# Patient Record
Sex: Female | Born: 2000
Health system: Southern US, Community
[De-identification: ages and names within clinical notes are randomized; demographics above are authoritative.]

---
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
## 2019-04-10 ENCOUNTER — Ambulatory Visit (INDEPENDENT_AMBULATORY_CARE_PROVIDER_SITE_OTHER): Payer: BC Managed Care – PPO | Admitting: Family Medicine

## 2019-04-10 ENCOUNTER — Other Ambulatory Visit: Payer: Self-pay

## 2019-04-10 VITALS — BP 122/58 | Ht 65.5 in | Wt 250.0 lb

## 2019-04-10 DIAGNOSIS — M25561 Pain in right knee: Secondary | ICD-10-CM | POA: Diagnosis not present

## 2019-04-10 DIAGNOSIS — G8929 Other chronic pain: Secondary | ICD-10-CM | POA: Diagnosis not present

## 2019-04-10 NOTE — Progress Notes (Signed)
    SUBJECTIVE:   CHIEF COMPLAINT: R knee pain  HPI:  Patient endorsing aggressively worsening right anterior knee pain that has been ongoing.  Denies any recent trauma or injury but did sustain a knee injury with playing volleyball a few years ago.  Reports receiving an MRI and a corticosteroid injection in this knee previously.  Pain described as constant soreness.  Does endorse stiffness and popping and crunching when walking up stairs.  She works at [employer] and is on her feet for 6-8 hours at a time.  Denies overlying skin changes, redness, warmth.  Pain is worse with extension.  PERTINENT  PMH / PSH: obesity, heart murmur, pes planus  OBJECTIVE:   BP (!) 122/58   Ht 5' 5.5" (1.664 m)   Wt 250 lb (113.4 kg)   BMI 40.97 kg/m   GEN: Morbidly obese, in NAD MSK:  Knee: Inspection: No obvious deformities or asymmetry Palpation: Nontender to palpation over bilateral joint lines, quad tendon, patellar compression.  Point tender to palpation over patellar tendon. ROM: Full AROM with flexion, extension. Strength: 5/5 with resisted flexion, extension, dorsiflexion, plantarflexion.  4/5 bilateral hip abduction and adduction. Stability: No joint laxity Special Tests: -Negative McMurray's -Negative Thessaly's -Negative Lachman's -Negative anterior/posterior drawer Neurovascular: Intact  Limited ultrasound of right knee Thickening of the patellar tendon with small hypoechoic area suggesting edema and inflammation within the tendon.  Findings suggestive of patellar tendinopathy.  No edema appreciable in suprapatellar bursa.  No bony abnormalities appreciated.   ASSESSMENT/PLAN:   Knee pain Exam and ultrasound consistent with patellar tendinopathy.  Will provide strengthening exercises as well as knee strap for additional support and pain relief.  Continue NSAIDs as needed.  Follow-up as needed.   Ellwood Dense, DO PGY-3, [facility] 04/10/2019 3:34 PM

## 2019-04-10 NOTE — Patient Instructions (Signed)
It was great to see you!  Our plans for today:  - Try a knee strap (see below). Put this right below where you are having the pain. - Do the strengthening exercises daily as prescribed. - Continue to take Tylenol, ibuprofen as needed. Ice after work or prolonged periods of standing can be helpful. - Follow-up as needed.     Patellar Tendinitis  Patellar tendinitis is also called jumper's knee or patellar tendinopathy. This condition happens when there is damage to and inflammation of the patellar tendon. Tendons are cord-like tissues that connect muscles to bones. The patellar tendon connects the bottom of the kneecap (patella) to the top of the shin bone (tibia). Patellar tendinitis causes pain in the front of the knee. The condition happens in the following stages:  Stage 1: In this stage, you have pain only after activity.  Stage 2: In this stage, you have pain during and after activity.  Stage 3: In this stage, you have pain at rest as well as during and after activity. The pain limits your ability to do the activity.  Stage 4: In this stage, the tendon tears and severely limits your activity. What are the causes? This condition is caused by repeated (repetitive) stress on the tendon. This stress may cause the tendon to stretch, swell, thicken, or tear. What increases the risk? The following factors may make you more likely to develop this condition:  Participating in sports that involve running, kicking, and jumping, especially on hard surfaces. These include: ? Basketball. ? Volleyball. ? Soccer. ? Track and field.  Training too hard.  Having tight thigh muscles.  Having received steroid injections in the tendon.  Having had knee surgery.  Being 74-37 years old. What are the signs or symptoms? The main symptom of this condition is pain and swelling in the front of the knee. The pain usually starts slowly and gradually gets worse. It may become painful to straighten  your leg. The pain may get worse when you walk, run, or jump. How is this diagnosed? This condition may be diagnosed based on:  Your symptoms.  Your medical history.  A physical exam. During the physical exam, your health care provider may check for: ? Tenderness in your patella. ? Tightness in your thigh muscles. ? Pain when you straighten your knee.  Imaging tests, including: ? X-rays. These will show the position and condition of your patella. ? An MRI. This will show any abnormality of the tendon. ? Ultrasound. This will show any swelling or other abnormalities of the tendon. How is this treated? Treatment for this condition depends on the stage of the condition. It may involve:  Avoiding activities that cause pain, such as jumping.  Icing and elevating your knee.  Having sound wave stimulation to promote healing.  Doing stretching and strengthening exercises (physical therapy) when pain and swelling improve.  Wearing a knee brace. This may be needed if your condition does not improve with treatment.  Using crutches or a walker. This may be needed if your condition does not improve with treatment.  Surgery. This may be done if you have stage 4 tendinitis. Follow these instructions at home: If you have a brace:  Wear the brace as told by your health care provider. Remove it only as told by your health care provider.  Loosen the brace if your toes tingle, become numb, or turn cold and blue.  Keep the brace clean.  If the brace is not waterproof: ? Do  not let it get wet. ? Cover it with a watertight covering when you take a bath or shower.  Ask your health care provider when it is safe for you to drive. Managing pain, stiffness, and swelling   If directed, put ice on the injured area. ? If you have a removable brace, remove it as told by your health care provider. ? Put ice in a plastic bag. ? Place a towel between your skin and the bag. ? Leave the ice on for  20 minutes, 2-3 times a day.  Move your toes often to reduce stiffness and swelling.  Raise (elevate) your knee above the level of your heart while you are sitting or lying down. Activity  Do not use the injured limb to support your body weight until your health care provider says that you can. Use your crutches or a walker as told by your health care provider.  Return to your normal activities as told by your health care provider. Ask your health care provider what activities are safe for you.  Do exercises as told by your health care provider or physical therapist. General instructions  Take over-the-counter and prescription medicines only as told by your health care provider.  Do not use any products that contain nicotine or tobacco, such as cigarettes, e-cigarettes, and chewing tobacco. These can delay healing. If you need help quitting, ask your health care provider.  Keep all follow-up visits as told by your health care provider. This is important. How is this prevented?  Warm up and stretch before being active.  Cool down and stretch after being active.  Give your body time to rest between periods of activity. ? You may need to reduce how often you play a sport that requires frequent jumping.  Make sure to use equipment that fits you.  Be safe and responsible while being active. This will help you avoid falls which can damage the tendon.  Do at least 150 minutes of moderate-intensity exercise each week, such as brisk walking or water aerobics.  Maintain physical fitness, including: ? Strength. ? Flexibility. ? Cardiovascular fitness. ? Endurance. Contact a health care provider if:  Your symptoms have not improved in 6 weeks.  Your symptoms get worse. Summary  Patellar tendinitis is also called jumper's knee or patellar tendinopathy. This condition happens when there is damage to and inflammation of the patellar tendon.  Treatment for this condition depends on  the stage of the condition and may include rest, ice, exercises, medicines, and surgery.  Do not use the injured limb to support your body weight until your health care provider says that you can.  Take over-the-counter and prescription medicines only as told by your health care provider.  Keep all follow-up visits as told by your health care provider. This is important. This information is not intended to replace advice given to you by your health care provider. Make sure you discuss any questions you have with your health care provider. Document Revised: 04/20/2018 Document Reviewed: 11/21/2017 Elsevier Patient Education  2020 [employer].

## 2019-04-10 NOTE — Assessment & Plan Note (Signed)
Exam and ultrasound consistent with patellar tendinopathy.  Will provide strengthening exercises as well as knee strap for additional support and pain relief.  Continue NSAIDs as needed.  Follow-up as needed.

## 2019-04-11 ENCOUNTER — Encounter: Payer: Self-pay | Admitting: Family Medicine

## 2019-06-14 DIAGNOSIS — Z20822 Contact with and (suspected) exposure to covid-19: Secondary | ICD-10-CM | POA: Diagnosis not present

## 2019-06-14 DIAGNOSIS — Z03818 Encounter for observation for suspected exposure to other biological agents ruled out: Secondary | ICD-10-CM | POA: Diagnosis not present

## 2019-06-25 ENCOUNTER — Other Ambulatory Visit: Payer: Self-pay | Admitting: Family Medicine

## 2019-07-31 ENCOUNTER — Encounter: Payer: Self-pay | Admitting: Family Medicine

## 2019-07-31 ENCOUNTER — Ambulatory Visit (INDEPENDENT_AMBULATORY_CARE_PROVIDER_SITE_OTHER): Payer: BC Managed Care – PPO | Admitting: Family Medicine

## 2019-07-31 ENCOUNTER — Other Ambulatory Visit: Payer: Self-pay

## 2019-07-31 VITALS — BP 108/78 | HR 75 | Ht 65.0 in | Wt 290.0 lb

## 2019-07-31 DIAGNOSIS — Z00129 Encounter for routine child health examination without abnormal findings: Secondary | ICD-10-CM

## 2019-07-31 DIAGNOSIS — H5213 Myopia, bilateral: Secondary | ICD-10-CM | POA: Diagnosis not present

## 2019-07-31 DIAGNOSIS — Z0000 Encounter for general adult medical examination without abnormal findings: Secondary | ICD-10-CM | POA: Diagnosis not present

## 2019-07-31 DIAGNOSIS — Z003 Encounter for examination for adolescent development state: Secondary | ICD-10-CM

## 2019-07-31 LAB — POCT GLYCOSYLATED HEMOGLOBIN (HGB A1C): Hemoglobin A1C: 5.3 % (ref 4.0–5.6)

## 2019-07-31 NOTE — Patient Instructions (Addendum)
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   Please make sure you continue to stay physically active and attempt to follow a well-balanced diet when you can.  You can always try meal prepping to avoid those times when you are busy.  Please call us if you have any questions or concerns.

## 2019-07-31 NOTE — Assessment & Plan Note (Addendum)
Reviewed past medical, surgical, and social history.  Medications updated in epic.  Health maintenance reviewed and updated recent Covid vaccination in 3-04/2019.  Will additionally obtain screening A1c due to elevated BMI.  Filled out band physical form.  Encouraged working towards a well-balanced diet and staying physically active.

## 2019-07-31 NOTE — Assessment & Plan Note (Signed)
Abnormal vision screening, patient reports she wears glasses occasionally to see far distances, does not wear everyday. Encouraged her to wear her glasses daily, especially with driving, and follow up with her optometrist.

## 2019-07-31 NOTE — Progress Notes (Signed)
.     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  She currently is at [employer] and plays the [facility] [employer] [facility] [employer], needs a sports physical form filled out.  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  GYN: Currently take Sprintec for contraception.  Not currently sexually active and does not want any STD screening today.  Menstrual cycles monthly.   Risk assessment: Denies any chest pain, dyspnea on exertion, SOB, palpitations, or syncope.  Currently exercising at the gym several days a week in between band practice and trying to follow a well-balanced diet.  No family history of significant cardiac disease or sudden death.   PERTINENT  PMH / PSH: Elevated BMI at 48, heart murmur several years ago  OBJECTIVE:   BP 108/78   Pulse 75   Ht 5\' 5"  (1.651 m)   Wt 290 lb (131.5 kg)   SpO2 98%   BMI 48.26 kg/m   General: Alert, NAD HEENT: NCAT, MMM, oropharynx nonerythematous, EOMI, PERRLA Cardiac: RRR no m/g/r appreciated Lungs: Clear bilaterally, no increased WOB  Abdomen: soft, non-tender, non-distended, normoactive BS Msk: Moves all extremities spontaneously, able to walk on tiptoes and heels without concern, normal gait Ext: Warm, dry, 2+ distal pulses, no edema   Psych: Normal mood and affect, engages in conversation with appropriate eye contact   Hearing Screening   125Hz  250Hz  500Hz  1000Hz  2000Hz  3000Hz  4000Hz  6000Hz  8000Hz   Right ear:           Left ear:             Visual Acuity Screening   Right eye Left eye Both eyes  Without correction: 20/100 20/40 20/40  With correction:       ASSESSMENT/PLAN:   Well adolescent visit Reviewed past medical, surgical, and social history.  Medications updated in epic.  Health maintenance reviewed and updated recent Covid vaccination in 3-04/2019.  Will additionally obtain screening A1c due to elevated BMI.  Filled out  band physical form.  Encouraged working towards a well-balanced diet and staying physically active.  Myopia of both eyes Abnormal vision screening, patient reports she wears glasses occasionally to see far distances, does not wear everyday. Encouraged her to wear her glasses daily, especially with driving, and follow up with her optometrist.      Follow-up annually or sooner if needed.  , DO [facility]

## 2019-08-17 DIAGNOSIS — R110 Nausea: Secondary | ICD-10-CM | POA: Diagnosis not present

## 2019-08-17 DIAGNOSIS — D649 Anemia, unspecified: Secondary | ICD-10-CM | POA: Diagnosis not present

## 2019-08-17 DIAGNOSIS — E860 Dehydration: Secondary | ICD-10-CM | POA: Diagnosis not present

## 2019-09-05 ENCOUNTER — Encounter: Payer: Self-pay | Admitting: Family Medicine

## 2019-09-05 ENCOUNTER — Ambulatory Visit (INDEPENDENT_AMBULATORY_CARE_PROVIDER_SITE_OTHER): Payer: BC Managed Care – PPO | Admitting: Family Medicine

## 2019-09-05 ENCOUNTER — Other Ambulatory Visit: Payer: Self-pay

## 2019-09-05 VITALS — BP 104/68 | HR 78 | Ht 65.0 in | Wt 283.6 lb

## 2019-09-05 DIAGNOSIS — D649 Anemia, unspecified: Secondary | ICD-10-CM

## 2019-09-05 NOTE — Patient Instructions (Signed)
I will send you a note about your labs. You can also view them in MyChart. Great to see you and have fun on your band trip to [facility]!

## 2019-09-05 NOTE — Progress Notes (Signed)
° ° °  CHIEF COMPLAINT / HPI:  was told by student health she was anemic. She has no symptoms. She does not remember the hgb level. Menses regular, occasionally heavy. Active and no fatigue, no SOB, no dizziness.her Mom is very worried about this and wants her checked "for everything". 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 Looking forward to a competition they have this coming weekend.   PERTINENT  PMH / PSH: I have reviewed the patients medications, allergies, past medical and surgical history, smoking status and updated in the EMR as appropriate.   OBJECTIVE:  BP 104/68    Pulse 78    Ht 5\' 5"  (1.651 m)    Wt 283 lb 9.6 oz (128.6 kg)    SpO2 98%    BMI 47.19 kg/m  GEN WD WN NAD CV RRR  ASSESSMENT / PLAN: Report of anemia by lab result at student health Parental concerns  Will check CBC and BMP.  No problem-specific Assessment & Plan notes found for this encounter.   MD

## 2019-09-06 LAB — BASIC METABOLIC PANEL
BUN/Creatinine Ratio: 13 (ref 9–23)
BUN: 7 mg/dL (ref 6–20)
CO2: 24 mmol/L (ref 20–29)
Calcium: 9.3 mg/dL (ref 8.7–10.2)
Chloride: 105 mmol/L (ref 96–106)
Creatinine, Ser: 0.55 mg/dL — ABNORMAL LOW (ref 0.57–1.00)
GFR calc Af Amer: 156 mL/min/{1.73_m2} (ref >59)
GFR calc non Af Amer: 135 mL/min/{1.73_m2} (ref >59)
Glucose: 79 mg/dL (ref 65–99)
Potassium: 3.9 mmol/L (ref 3.5–5.2)
Sodium: 143 mmol/L (ref 134–144)

## 2019-09-06 LAB — CBC
Hematocrit: 36.9 % (ref 34.0–46.6)
Hemoglobin: 11.6 g/dL (ref 11.1–15.9)
MCH: 23.6 pg — ABNORMAL LOW (ref 26.6–33.0)
MCHC: 31.4 g/dL — ABNORMAL LOW (ref 31.5–35.7)
MCV: 75 fL — ABNORMAL LOW (ref 79–97)
Platelets: 368 10*3/uL (ref 150–450)
RBC: 4.91 x10E6/uL (ref 3.77–5.28)
RDW: 14.6 % (ref 11.7–15.4)
WBC: 10.9 10*3/uL — ABNORMAL HIGH (ref 3.4–10.8)

## 2019-09-10 ENCOUNTER — Encounter: Payer: Self-pay | Admitting: Family Medicine

## 2019-09-10 DIAGNOSIS — R718 Other abnormality of red blood cells: Secondary | ICD-10-CM | POA: Insufficient documentation

## 2019-09-10 NOTE — Progress Notes (Signed)
See letter to patient Mild decreased MCV and MCH Both are stable

## 2019-11-14 ENCOUNTER — Ambulatory Visit: Payer: BC Managed Care – PPO | Attending: Family

## 2019-11-14 DIAGNOSIS — Z23 Encounter for immunization: Secondary | ICD-10-CM

## 2020-01-27 NOTE — Progress Notes (Signed)
   Covid-19 Vaccination Clinic  Name:  Tracy Ayala    MRN: [id number] DOB: 08-Aug-2000  01/27/2020  [name] was observed post Covid-19 immunization for 15 minutes without incident. She was provided with Vaccine Information Sheet and instruction to access the V-Safe system.   [name] was instructed to call 911 with any severe reactions post vaccine: Marland Kitchen Difficulty breathing  . Swelling of face and throat  . A fast heartbeat  . A bad rash all over body  . Dizziness and weakness   Immunizations Administered    Name Date Dose VIS Date Route   Pfizer COVID-19 Vaccine 11/14/2019  9:30 AM 0.3 mL 10/31/2019 Intramuscular   Manufacturer: [employer], [employer]   Lot: [id number]   NDC: [address]-2

## 2020-04-09 IMAGING — CR DG ANKLE COMPLETE 3+V*R*
3 series · 3 of 3 positions shown · non-contrast
Comparison: None.

CLINICAL DATA: Twisting injury several weeks ago, initial encounter

EXAM:
RIGHT ANKLE - COMPLETE 3+ VIEW

[t ankle joint ap right]
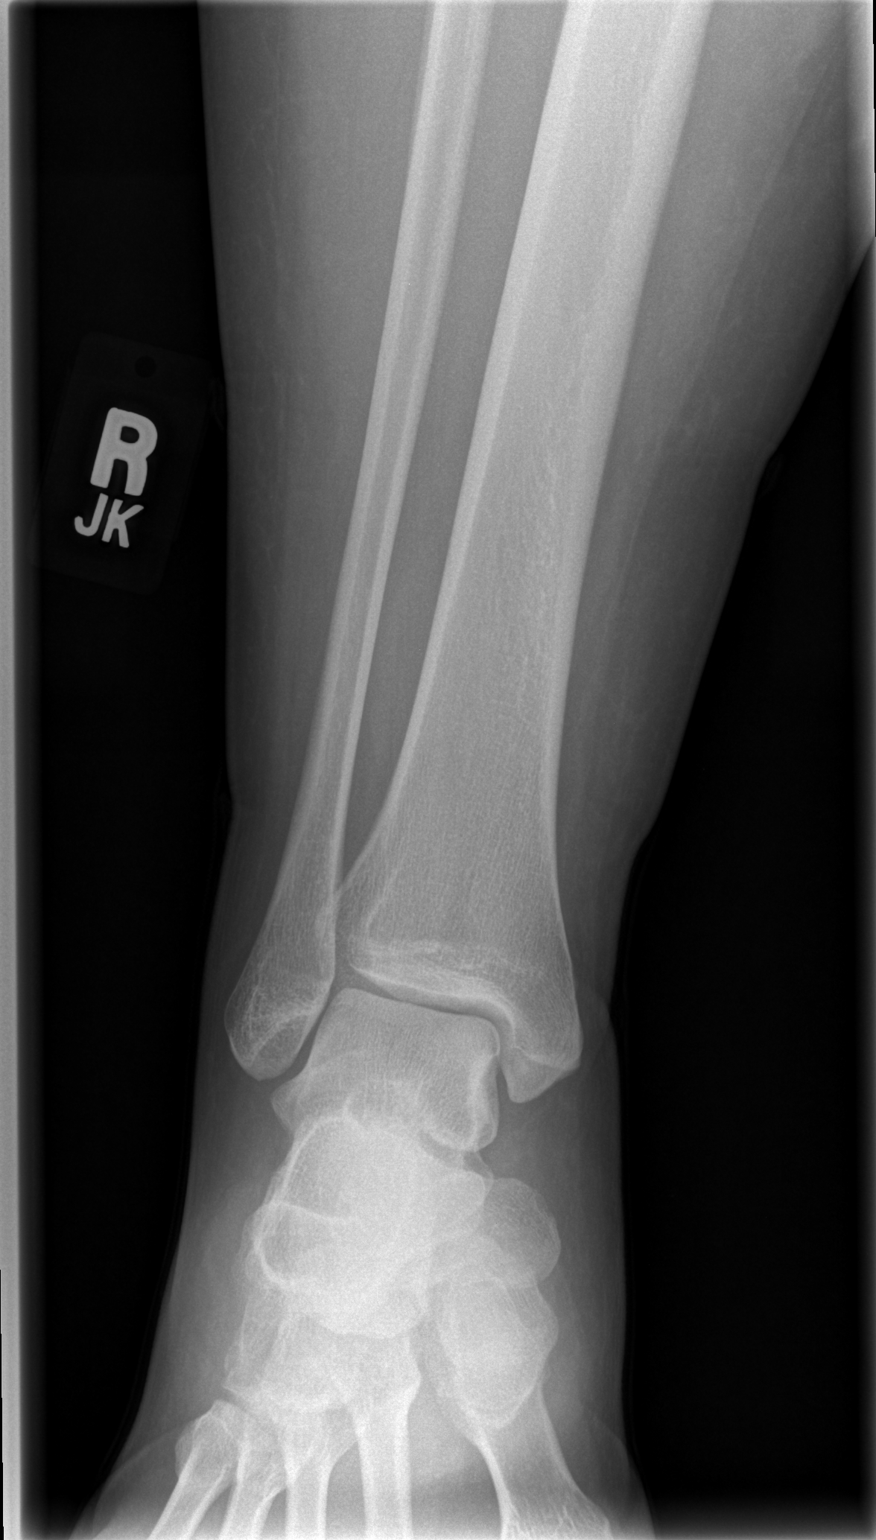

[t ankle joint oblique right]
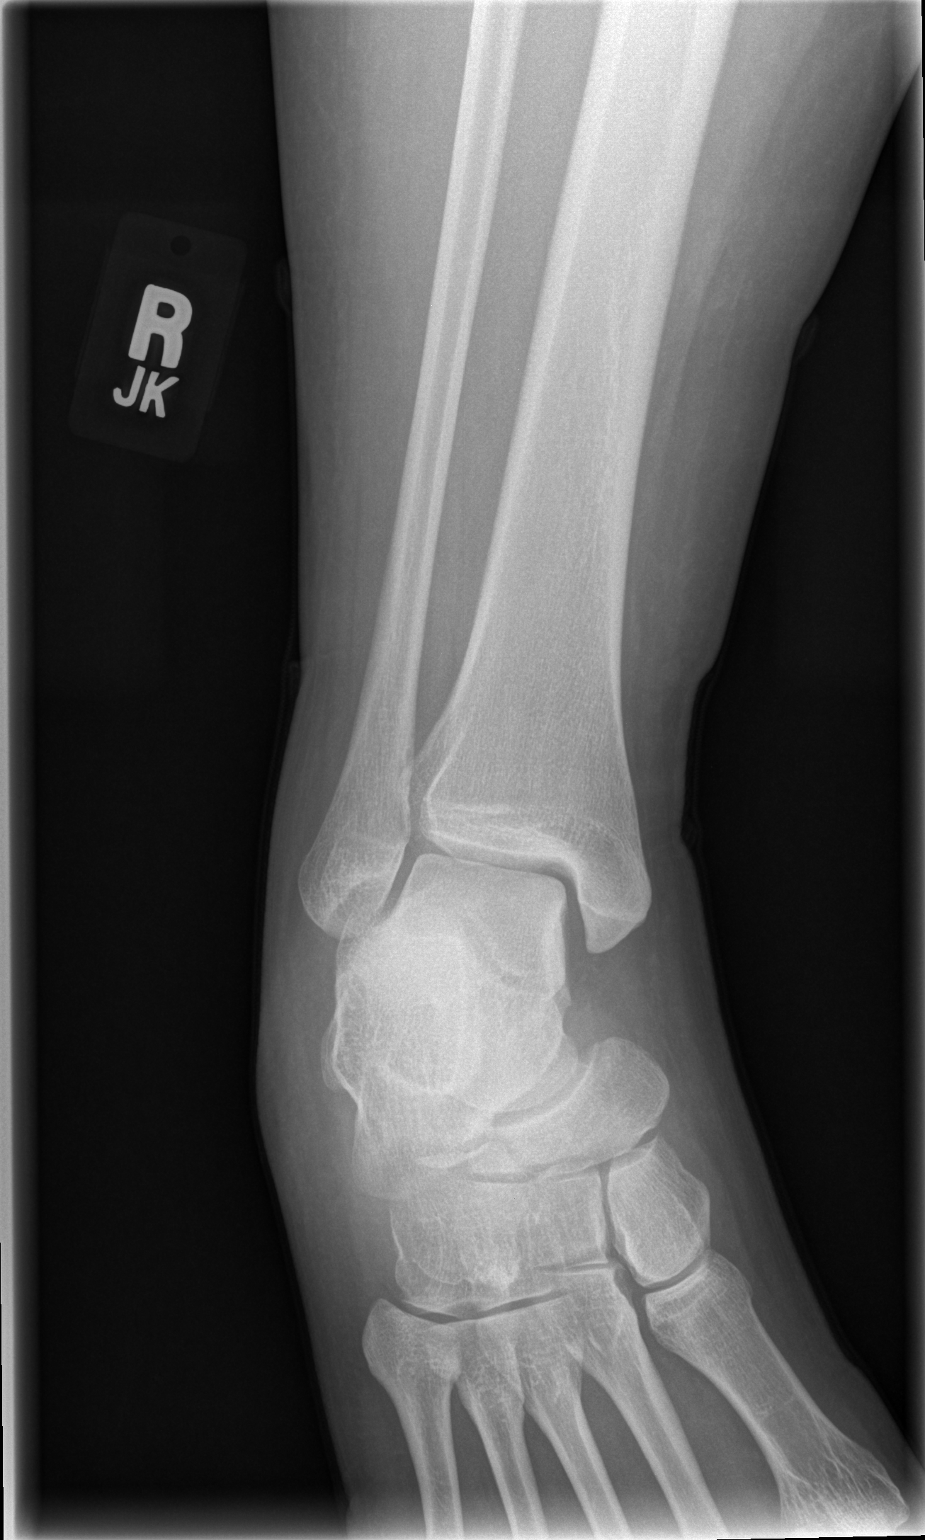

[t ankle joint lat right]
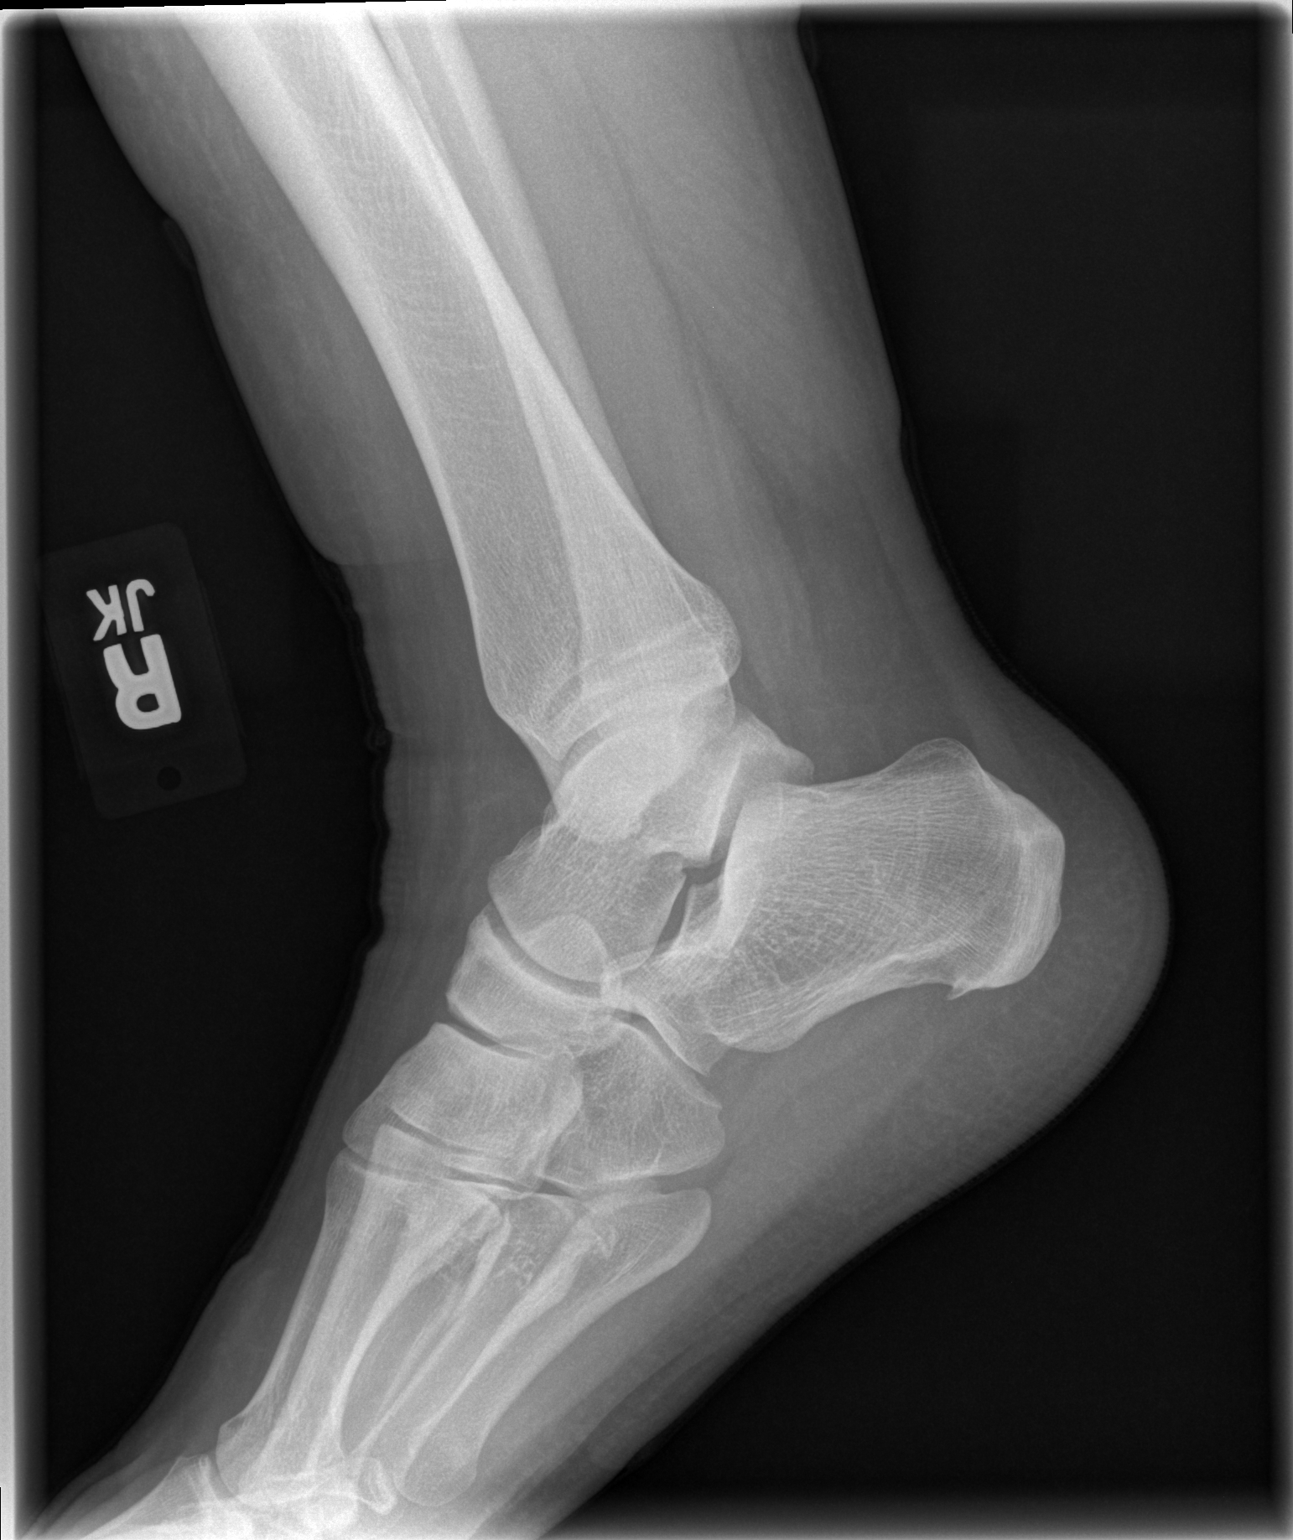

[3 of 3 positions shown; findings below may reference images not displayed]

FINDINGS: No acute fracture or dislocation is noted. No gross soft tissue
abnormality is seen. Small plantar calcaneal spur is noted.
IMPRESSION: No acute abnormality noted.

## 2020-04-29 ENCOUNTER — Other Ambulatory Visit: Payer: Self-pay

## 2020-04-29 ENCOUNTER — Ambulatory Visit (INDEPENDENT_AMBULATORY_CARE_PROVIDER_SITE_OTHER): Payer: Self-pay | Admitting: Family Medicine

## 2020-04-29 DIAGNOSIS — J302 Other seasonal allergic rhinitis: Secondary | ICD-10-CM | POA: Insufficient documentation

## 2020-04-29 DIAGNOSIS — J301 Allergic rhinitis due to pollen: Secondary | ICD-10-CM

## 2020-04-29 MED ORDER — CETIRIZINE HCL 10 MG PO TABS: 10.0000 mg | ORAL_TABLET | Freq: Every day | ORAL | 11 refills | Status: DC

## 2020-04-29 MED ORDER — FLUTICASONE PROPIONATE 50 MCG/ACT NA SUSP: 2.0000 | Freq: Every day | NASAL | 6 refills | Status: DC

## 2020-04-29 NOTE — Progress Notes (Signed)
    SUBJECTIVE:   CHIEF COMPLAINT / HPI:   Patient presenting today with concerns about allergies.  Patient reports she has been having headaches, runny nose, congestion, sneezing, itchy, watery eyes that have been getting worse over the last few weeks.  She does report a mild cough.  She does report that she has tested herself for COVID 6 times and all of these have come back negative.  She has tried Benadryl once with mild relief.  She does not have any history of seasonal allergies and has not tried other over-the-counter allergy medications.  She denies any fevers, chills.  PERTINENT  PMH / PSH: None  OBJECTIVE:   BP 98/60   Pulse 85   Ht 5\' 6"  (1.676 m)   LMP 04/14/2020 (Approximate)   SpO2 99%   BMI 45.77 kg/m    Gen: NAD, alert, non-toxic, well-nourished, well-appearing, pleasant HEENT: Normocephaic, atraumatic. PERRLA, clear conjuctiva, no scleral icterus and injection. Normal EOM.  Hearing intact. TM pearly grey bilaterally with no fluid.  Neck supple with no LAD, nodules, or gross abnormality.  Nares patent with no discharge.  Maxillary and frontal sinuses nontender to palpation. Mild erythematous, swollen turbinates bilaterally.  Oropharynx without erythema and lesions.  Tonsils nonswollen and without exudate.   Resp:  No increased work of breathing appreciated. Skin: No obvious rashes, lesions, or trauma.  Normal turgor.   ASSESSMENT/PLAN:   Seasonal allergic rhinitis Patient symptoms are consistent with seasonal allergies.  She does not have any fevers or chills to suggest an infectious etiology of her symptoms.  We will try Zyrtec and Flonase.  Provided patient instructions for use.  Also provided patient information packet about allergies and how to avoid triggers and symptoms.  Return to care if no improvement with medications.   06/14/2020, MD [facility]

## 2020-04-29 NOTE — Patient Instructions (Signed)

## 2020-04-29 NOTE — Assessment & Plan Note (Signed)
Patient symptoms are consistent with seasonal allergies.  She does not have any fevers or chills to suggest an infectious etiology of her symptoms.  We will try Zyrtec and Flonase.  Provided patient instructions for use.  Also provided patient information packet about allergies and how to avoid triggers and symptoms.  Return to care if no improvement with medications.

## 2020-07-30 ENCOUNTER — Encounter: Payer: Self-pay | Admitting: Family Medicine

## 2020-11-11 ENCOUNTER — Encounter (HOSPITAL_COMMUNITY): Payer: Self-pay

## 2020-11-11 ENCOUNTER — Ambulatory Visit (HOSPITAL_COMMUNITY)
Admission: EM | Admit: 2020-11-11 | Discharge: 2020-11-11 | Disposition: A | Discharge status: Home / Self Care | Payer: BLUE CROSS/BLUE SHIELD

## 2020-11-11 ENCOUNTER — Other Ambulatory Visit: Payer: Self-pay

## 2020-11-11 DIAGNOSIS — J069 Acute upper respiratory infection, unspecified: Secondary | ICD-10-CM | POA: Diagnosis not present

## 2020-11-11 NOTE — Discharge Instructions (Signed)
Try using Mucinex (generic guaifenesin is okay to use) and continue drinking lots of liquids.  Keep using your Flonase nasal spray.  I would also suggest purchasing saline nasal spray and using it several times a day (at a different time than the Flonase nasal spray).

## 2020-11-11 NOTE — ED Provider Notes (Signed)
[facility]    CSN: [id number] Arrival date & time: 11/11/20  1413      History   Chief Complaint Chief Complaint  Patient presents with  . Sore Throat    HPI Tracy Ayala is a 20 y.o. female.  Patient reports nasal congestion and sore throat since 11/08/2020.  Has been treating with hot tea, cough drops and Tylenol.  Denies cough, shortness of breath, headache, myalgias, fever, chills, abdominal pain, nausea, or vomiting.   Sore Throat   Past Medical History:  Diagnosis Date  . Knee pain 05/31/2012  . Oligomenorrhea 09/09/2014    Patient Active Problem List   Diagnosis Date Noted  . Seasonal allergic rhinitis 04/29/2020  . Low mean corpuscular volume (MCV) 09/10/2019  . Myopia of both eyes 07/31/2019  . Well adolescent visit 06/01/2017  . Heart murmur 09/09/2014  . Obesity 04/18/2007    History reviewed. No pertinent surgical history.  OB History   No obstetric history on file.      Home Medications    Prior to Admission medications   Medication Sig Start Date End Date Taking? Authorizing Provider  cetirizine (ZYRTEC) 10 MG tablet Take 1 tablet (10 mg total) by mouth daily. 04/29/20   Melene Plan, MD  fluticasone (FLONASE) 50 MCG/ACT nasal spray Place 2 sprays into both nostrils daily. 04/29/20   Melene Plan, MD  SPRINTEC 28 0.25-35 MG-MCG tablet TAKE 1 TABLET BY MOUTH EVERY DAY 06/26/19   Nestor Ramp, MD    Family History Family History  Problem Relation Age of Onset  . Diabetes Paternal Uncle   . Hypertension Mother   . Heart disease Maternal Uncle     Social History Social History   Tobacco Use  . Smoking status: Never  . Smokeless tobacco: Never  Substance Use Topics  . Alcohol use: Not Currently  . Drug use: Not Currently     Allergies   Amoxicillin, Other, and Rondec   Review of Systems Review of Systems   Physical Exam Triage Vital Signs ED Triage Vitals [11/11/20 1554]  Enc Vitals Group     BP 109/72      Pulse Rate 69     Resp 18     Temp 98.1 F (36.7 C)     Temp src      SpO2 95 %     Weight      Height      Head Circumference      Peak Flow      Pain Score 5     Pain Loc      Pain Edu?      Excl. in GC?    No data found.  Updated Vital Signs BP 109/72 (BP Location: Right Arm)   Pulse 69   Temp 98.1 F (36.7 C)   Resp 18   LMP 10/14/2020   SpO2 95%   Visual Acuity Right Eye Distance:   Left Eye Distance:   Bilateral Distance:    Right Eye Near:   Left Eye Near:    Bilateral Near:     Physical Exam Constitutional:      Appearance: She is well-developed. She is obese. She is not ill-appearing.  HENT:     Right Ear: Tympanic membrane, ear canal and external ear normal.     Left Ear: Tympanic membrane, ear canal and external ear normal.     Nose: Congestion and rhinorrhea present.     Mouth/Throat:  Mouth: Mucous membranes are moist.     Pharynx: Oropharynx is clear.  Cardiovascular:     Rate and Rhythm: Normal rate and regular rhythm.  Pulmonary:     Effort: Pulmonary effort is normal.     Breath sounds: Normal breath sounds.  Lymphadenopathy:     Cervical: No cervical adenopathy.  Neurological:     Mental Status: She is alert.     UC Treatments / Results  Labs (all labs ordered are listed, but only abnormal results are displayed) Labs Reviewed - No data to display  EKG   Radiology No results found.  Procedures Procedures (including critical care time)  Medications Ordered in UC Medications - No data to display  Initial Impression / Assessment and Plan / UC Course  I have reviewed the triage vital signs and the nursing notes.  Pertinent labs & imaging results that were available during my care of the patient were reviewed by me and considered in my medical decision making (see chart for details).    Reviewed supportive care measures with patient.  Given note for work and school.   Final Clinical Impressions(s) / UC Diagnoses    Final diagnoses:  Viral upper respiratory tract infection     Discharge Instructions      Try using Mucinex (generic guaifenesin is okay to use) and continue drinking lots of liquids.  Keep using your Flonase nasal spray.  I would also suggest purchasing saline nasal spray and using it several times a day (at a different time than the Flonase nasal spray).   ED Prescriptions   None    PDMP not reviewed this encounter.   Cathlyn Parsons, NP 11/11/20 1624

## 2020-11-11 NOTE — ED Triage Notes (Signed)
Pt c/o sore throat, nasal congestion, and headaches since Saturday. Took tylenol and cough drops with slight relief.

## 2021-06-16 ENCOUNTER — Encounter: Payer: Self-pay | Admitting: *Deleted

## 2021-07-01 ENCOUNTER — Encounter: Payer: BLUE CROSS/BLUE SHIELD | Admitting: Family Medicine

## 2021-07-29 ENCOUNTER — Other Ambulatory Visit (HOSPITAL_COMMUNITY)
Admission: RE | Admit: 2021-07-29 | Discharge: 2021-07-29 | Disposition: A | Discharge status: Home / Self Care | Payer: BLUE CROSS/BLUE SHIELD | Source: Ambulatory Visit | Attending: Family Medicine | Admitting: Family Medicine

## 2021-07-29 ENCOUNTER — Encounter: Payer: Self-pay | Admitting: Family Medicine

## 2021-07-29 ENCOUNTER — Ambulatory Visit (INDEPENDENT_AMBULATORY_CARE_PROVIDER_SITE_OTHER): Payer: BLUE CROSS/BLUE SHIELD | Admitting: Family Medicine

## 2021-07-29 VITALS — BP 122/76 | HR 70 | Ht 66.0 in | Wt 294.6 lb

## 2021-07-29 DIAGNOSIS — R718 Other abnormality of red blood cells: Secondary | ICD-10-CM | POA: Diagnosis not present

## 2021-07-29 DIAGNOSIS — Z124 Encounter for screening for malignant neoplasm of cervix: Secondary | ICD-10-CM | POA: Diagnosis present

## 2021-07-29 DIAGNOSIS — Z01419 Encounter for gynecological examination (general) (routine) without abnormal findings: Secondary | ICD-10-CM | POA: Diagnosis not present

## 2021-07-29 DIAGNOSIS — N926 Irregular menstruation, unspecified: Secondary | ICD-10-CM

## 2021-07-29 DIAGNOSIS — Z23 Encounter for immunization: Secondary | ICD-10-CM

## 2021-07-29 MED ORDER — NORGESTIM-ETH ESTRAD TRIPHASIC 0.18/0.215/0.25 MG-25 MCG PO TABS: 1.0000 | ORAL_TABLET | Freq: Every day | ORAL | 3 refills | Status: DC

## 2021-07-29 NOTE — Patient Instructions (Signed)
It was great to see you today!.  I am checking a screening cholesterol panel for you and we will recheck your hemoglobin and iron stores.  In the past you had slightly low [facility] which as we discussed is probably not worrisome.  If it is normal or in the same range today and your ferritin is normal, I do not think we need to do anything differently.  I will send you note about these in the mail.  I will also send you a note about your Pap smear.  I have sent in a prescription for Ortho Tri-Cyclen.  If you have any questions or problems with that or if you start to have heavy clotting with that contraceptive pill, please feel free to call me or send me a MyChart message.  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  Enjoy your trip to [place]!

## 2021-07-29 NOTE — Assessment & Plan Note (Signed)
Initial Pap smear done today.

## 2021-07-29 NOTE — Assessment & Plan Note (Signed)
History of low MCH.  Will check today but in the past this has been stable.

## 2021-07-29 NOTE — Progress Notes (Signed)
    CHIEF COMPLAINT / HPI:  Well check   PERTINENT  PMH / PSH: I have reviewed the patient's medications, allergies, past medical and surgical history, smoking status and updated in the EMR as appropriate. History of heart murmur on her chart.  Looking back through the chart, no heart murmur has been heard many years.  I hear nothing today.  Was likely a functional murmur as a child.  We will take that off for problem list today.  OBJECTIVE:  BP 122/76   Pulse 70   Ht 5\' 6"  (1.676 m)   Wt 294 lb 9.6 oz (133.6 kg)   LMP 07/08/2021 (Approximate)   SpO2 100%   BMI 47.55 kg/m  Vital signs reviewed GENERALl: Well developed, well nourished, in no acute distress. HEENT: PERRLA, EOMI, sclerae are nonicteric NECK: Supple, FROM, without lymphadenopathy.  THYROID: normal without nodularity CAROTID ARTERIES: without bruits LUNGS: clear to auscultation bilaterally. No wheezes or rales. Normal respiratory effort HEART: Regular rate and rhythm, no murmurs. Distal pulses are bilaterally symmetrical, 2+. ABDOMEN: soft with positive bowel sounds. No masses noted MSK: MOE x 4. Normal muscle strength, bulk and tone. SKIN no rash. Normal temperature. NEURO: no focal deficits. Normal gait. Normal balance.   ASSESSMENT / PLAN:  Update immunizations.  Labs. 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  Currently also working full-time at [employer] and starting full-time masters degree in August.  Well woman exam Initial Pap smear done today.  Irregular menses Discussed options.  We will try her on tricyclic OCP.  Discussed that clots in themselves are not worrisome.  She will call or send me a MyChart message if she is having any questions or issues.  Low mean corpuscular volume (MCV) History of low MCH.  Will check today but in the past this has been stable.  September MD

## 2021-07-29 NOTE — Assessment & Plan Note (Signed)
Discussed options.  We will try her on tricyclic OCP.  Discussed that clots in themselves are not worrisome.  She will call or send me a MyChart message if she is having any questions or issues.

## 2021-07-30 LAB — CBC
Hematocrit: 39.9 % (ref 34.0–46.6)
Hemoglobin: 13 g/dL (ref 11.1–15.9)
MCH: 25.1 pg — ABNORMAL LOW (ref 26.6–33.0)
MCHC: 32.6 g/dL (ref 31.5–35.7)
MCV: 77 fL — ABNORMAL LOW (ref 79–97)
Platelets: 361 10*3/uL (ref 150–450)
RBC: 5.18 x10E6/uL (ref 3.77–5.28)
RDW: 13.3 % (ref 11.7–15.4)
WBC: 6.6 10*3/uL (ref 3.4–10.8)

## 2021-07-30 LAB — FERRITIN: Ferritin: 88 ng/mL (ref 15–150)

## 2021-07-30 LAB — LIPID PANEL
Chol/HDL Ratio: 4 ratio (ref 0.0–4.4)
Cholesterol, Total: 195 mg/dL (ref 100–199)
HDL: 49 mg/dL (ref >39)
LDL Chol Calc (NIH): 124 mg/dL — ABNORMAL HIGH (ref 0–99)
Triglycerides: 122 mg/dL (ref 0–149)
VLDL Cholesterol Cal: 22 mg/dL (ref 5–40)

## 2021-08-03 LAB — CYTOLOGY - PAP: Diagnosis: NEGATIVE

## 2021-08-07 ENCOUNTER — Encounter: Payer: Self-pay | Admitting: Family Medicine

## 2022-07-29 ENCOUNTER — Other Ambulatory Visit: Payer: Self-pay | Admitting: Family Medicine

## 2024-01-19 ENCOUNTER — Ambulatory Visit: Payer: Self-pay | Admitting: Family Medicine

## 2024-01-19 VITALS — BP 121/76 | HR 90 | Temp 100.9°F | Ht 66.0 in | Wt 294.2 lb

## 2024-01-19 DIAGNOSIS — J069 Acute upper respiratory infection, unspecified: Secondary | ICD-10-CM

## 2024-01-19 MED ORDER — BENZONATATE 200 MG PO CAPS: 200.0000 mg | ORAL_CAPSULE | Freq: Two times a day (BID) | ORAL | 0 refills | Status: DC | PRN

## 2024-01-19 NOTE — Progress Notes (Signed)
" ° °  SUBJECTIVE:   CHIEF COMPLAINT / HPI:  Discussed the use of AI scribe software for clinical note transcription with the patient, who gave verbal consent to proceed.  History of Present Illness Tracy Ayala is a 24 year old female who presents with fever, cough, and chest pain.  Fever and constitutional symptoms - Fever with Tmax 102F over last three days, temporarily responsive to alternating Tylenol and DayQuil but recurs - Chills and marked fatigue present - Poor appetite - No recent sick contacts identified - Works as an tourist information centre manager  Respiratory symptoms - Persistent cough beginning on day two of illness - Cough associated with chest pain that worsens with coughing - Burning sensation in chest with a feeling of mucus accumulation  Headache - Headaches present since onset of illness  Gastrointestinal symptoms - No nausea, vomiting, diarrhea, or constipation   OBJECTIVE:  BP 121/76   Pulse 90   Temp (!) 100.9 F (38.3 C)   Ht 5' 6 (1.676 m)   Wt 294 lb 3.2 oz (133.4 kg)   SpO2 100%   BMI 47.49 kg/m   Physical Exam GENERAL: Alert, cooperative, well developed, no acute distress HEENT: Normocephalic, mildly red throat, moist mucous membranes NECK: No cervical lymphadenopathy CHEST: Clear to auscultation bilaterally, no wheezes, rhonchi, or crackles CARDIOVASCULAR: Normal heart rate and rhythm, S1 and S2 normal without murmurs ABDOMEN: Soft, non-tender, normal bowel sounds NEUROLOGICAL: Cranial nerves grossly intact, moves all extremities without gross motor or sensory deficit  ASSESSMENT/PLAN:   Assessment & Plan Viral URI with cough Symptoms consistent with viral process with high likelihood of influenza due to high local positivity rate. No pneumonia or antibiotic need. Tamiflu ineffective at this point due to symptom duration. Expected symptom peak in five to seven days. - Continue acetaminophen and ibuprofen  for fever and pain. - Use hot  steam showers, humidifiers, and Neti pots for congestion. - Prescribed Tessalon  Perles for cough suppression. Avoid crushing and keep away from children. She is not sexually active and not taking contraception at this time. She was advised to increase protection if sexually active on this medication given potential risk to a fetus. - Advised rest and hydration. - Instructed to call if symptoms worsen or persist.  Stuart Redo, MD [facility] "

## 2024-01-19 NOTE — Patient Instructions (Signed)
 VISIT SUMMARY: Today, you were seen for fever, cough, and chest pain. Your symptoms are consistent with influenza, and we discussed a plan to manage your symptoms and support your recovery.  YOUR PLAN: INFLUENZA WITH RESPIRATORY MANIFESTATIONS: Your symptoms are consistent with influenza, which is currently common in the area. You do not have pneumonia, and antibiotics are not needed. -Continue taking acetaminophen and ibuprofen  to manage your fever and pain. -Use hot steam showers, humidifiers, and Neti pots to help with congestion. -I have prescribed Tessalon  Perles to help suppress your cough. Do not crush the capsules and keep them away from children. -Make sure to get plenty of rest and stay hydrated. -Call us  if your symptoms worsen or do not improve within five to seven days.  Please let me know if you have any other questions.  Dr. Tharon

## 2024-02-09 ENCOUNTER — Ambulatory Visit: Admitting: Family Medicine

## 2024-02-09 VITALS — BP 104/62 | HR 100 | Temp 97.8°F | Wt 295.0 lb

## 2024-02-09 DIAGNOSIS — N920 Excessive and frequent menstruation with regular cycle: Secondary | ICD-10-CM | POA: Diagnosis not present

## 2024-02-09 DIAGNOSIS — H6501 Acute serous otitis media, right ear: Secondary | ICD-10-CM | POA: Diagnosis not present

## 2024-02-09 LAB — POCT HEMOGLOBIN: Hemoglobin: 13 g/dL (ref 11–14.6)

## 2024-02-09 LAB — POCT URINE PREGNANCY: Preg Test, Ur: NEGATIVE

## 2024-02-09 NOTE — Progress Notes (Signed)
"          ° °  SUBJECTIVE:   CHIEF COMPLAINT / HPI:  Discussed the use of AI scribe software for clinical note transcription with the patient, who gave verbal consent to proceed.  History of Present Illness Tracy Ayala is a 24 year old female who presents with ear pain following a recent viral upper respiratory infection.  Right otalgia and hearing changes - Right ear pain for approximately three days, worsening today - Hearing described as 'like I'm underwater' - No ear drainage - No fevers recently  - Other URI symptoms had been improving.    Recent viral upper respiratory infection - Onset January 8 with maximum temperature of 102F - Symptoms have been improving - No recurrent fever since initial illness - No sinus pain - Persistent cough, no shortness of breath  - No history of asthma - Using Tylenol and ibuprofen  as needed for pain  After visit was over patient brought up 2 week long menorrhagia. No light headedness, palpitations, shortness of breath. Has regular menstrual cycles after stopping her OCP one year ago. No acne or hirsutism. Does have pain with periods but not debilitating according to patient. Is not sexually active. No other history of easy bruising or bleeding.    PERTINENT  PMH / PSH: seasonal allergic rhinitis   OBJECTIVE:  BP 104/62   Pulse 100   Temp 97.8 F (36.6 C) (Oral)   Wt 295 lb (133.8 kg)   LMP 01/26/2024   SpO2 98%   BMI 47.61 kg/m    General: well appearing, in no acute distress HEENT: no rhinorrhea, no conjunctivitis, no TTP over frontal or maxillary sinuses. L ear canal clear with mild serous effusion no bulging of TM or erythema, Right TM with mild circumferential erythema and serous effusion no bulging.  Moist mucous membranes CV: RRR, radial pulses equal and palpable, no BLE edema  Resp: Normal work of breathing on room air, CTAB, no wheezing  Neuro: Alert & Oriented x 4   ASSESSMENT/PLAN:   Assessment & Plan Non-recurrent  acute serous otitis media of right ear Fluid behind tympanic membrane with mild erythema, likely secondary to recent viral upper respiratory infection. Bacterial etiology less likely. - Recommended OTC flonase  and antihistamine.  - Advised follow-up if symptoms worsen, becomes febrile for multiple days, or does not improve.  Menorrhagia with regular cycle Unable to do full workup given patient brought this up after her visit was complete and lab was already closed for the day. Less likely pregnancy given not sexually active. DDX is broad including but not limited to thyroid disease, uterine fibroids, endometriosis, etc.  - Scheduled for appointment tomorrow.  - POC hgb  - Urine beta hcg     Areta Saliva, MD [facility] "

## 2024-02-09 NOTE — Patient Instructions (Signed)
 It was wonderful to see you today.  Please bring ALL of your medications with you to every visit.   VISIT SUMMARY: During your visit, we discussed your recent ear pain and symptoms following a viral upper respiratory infection. You have been experiencing right ear pain, hearing changes  YOUR PLAN: -SEROUS OTITIS MEDIA: Serous otitis media is the presence of fluid behind the eardrum, often due to a recent infection. Please start flonase  and an antihistamine like cetirizine  or loratadine over the counter. You can use tylenol or ibupfoen for pain. Please follow up if your symptoms worsen or you get fevers.   INSTRUCTIONS: Please follow up if your ear pain or other symptoms worsen. Continue using Tylenol and ibuprofen  as needed for pain relief.  Contains text generated by [employer].   Thank you for choosing [facility].   Please call [phone] with any questions about today's appointment.  Areta Saliva, MD  Family Medicine

## 2024-02-10 ENCOUNTER — Encounter: Payer: Self-pay | Admitting: Family Medicine

## 2024-02-10 ENCOUNTER — Ambulatory Visit: Admitting: Family Medicine

## 2024-02-10 VITALS — BP 139/78 | HR 82 | Ht 66.0 in | Wt 296.4 lb

## 2024-02-10 DIAGNOSIS — N920 Excessive and frequent menstruation with regular cycle: Secondary | ICD-10-CM

## 2024-02-10 DIAGNOSIS — E785 Hyperlipidemia, unspecified: Secondary | ICD-10-CM

## 2024-02-10 DIAGNOSIS — H66001 Acute suppurative otitis media without spontaneous rupture of ear drum, right ear: Secondary | ICD-10-CM

## 2024-02-10 MED ORDER — AMOXICILLIN-POT CLAVULANATE 875-125 MG PO TABS: 1.0000 | ORAL_TABLET | Freq: Two times a day (BID) | ORAL | 0 refills | Status: DC

## 2024-02-10 MED ORDER — DOXYCYCLINE HYCLATE 100 MG PO TABS: 100.0000 mg | ORAL_TABLET | Freq: Two times a day (BID) | ORAL | 0 refills | Status: AC

## 2024-02-10 NOTE — Progress Notes (Signed)
" ° ° °  SUBJECTIVE:   CHIEF COMPLAINT / HPI:   Discussed the use of AI scribe software for clinical note transcription with the patient, who gave verbal consent to proceed.  Abnormal uterine bleeding - Heavy, prolonged menstrual bleeding lasting more than one week per cycle since October - Monthly intermenstrual spotting - Heavy cycles since discontinuing birth control in 2019 - No recent passage of clots - Last episode of passing clots occurred in January of last year - No prior pelvic ultrasound or other imaging performed - Currently not using any form of birth control - Previously used several contraceptives, including a tricyclic oral pill, discontinued last year due to worsening bleeding - Occasional right-sided pelvic pain - Pain does not occur every month  Otalgia - Persistent ear pain since recent flu infection - Ongoing discomfort despite use of antihistamines for congestion - No fever or chills       PERTINENT  PMH / PSH: None  OBJECTIVE:   BP 139/78   Pulse 82   Ht 5' 6 (1.676 m)   Wt 296 lb 6.4 oz (134.4 kg)   LMP 01/26/2024   SpO2 96%   BMI 47.84 kg/m    General: NAD, pleasant, able to participate in exam HEENT: Right TM with yellow/green fluid adequate for TM with bulging noted and significant erythema.  Left TM with mild serous fluid behind TM.  No palpable cervical lymphadenopathy.  Mildly erythematous oropharynx Cardiac: RRR, no murmurs. Respiratory: CTAB, normal effort, No wheezes, rales or rhonchi Extremities: no edema or cyanosis. Skin: warm and dry, no rashes noted Neuro: alert, no obvious focal deficits Psych: Normal affect and mood  ASSESSMENT/PLAN:   Assessment & Plan Menorrhagia with regular cycle Notable intra cycle bleeding, consideration of possible cervical polyp vs fibroid therefore we will obtain pelvic ultrasound.  Discussed options for bleeding given no relief with multiple OCPs previously, open to IUD pending ultrasound  results. -Pelvic ultrasound -TSH -Consider IUD pending imaging Hyperlipidemia, unspecified hyperlipidemia type Previously elevated, repeat lipid panel with patient first. Non-recurrent acute suppurative otitis media of right ear without spontaneous rupture of tympanic membrane Noted upon exam, reaction of hives with amoxicillin  use therefore will trial doxycycline . -Doxycycline  100 mg twice daily x 5 days -Pain management with Tylenol/ibuprofen  as needed   Dr. Izetta Nap, DO  [facility]     "

## 2024-02-10 NOTE — Patient Instructions (Signed)
 It was wonderful to see you today! Thank you for choosing [facility].   Please bring ALL of your medications with you to every visit.   Today we talked about:  For your prolonged menstrual cycles and spotting in between cycles we are going to get a pelvic ultrasound, our office run it through your insurance and then help you get it scheduled.  We are also checking blood work today for your thyroid to look for other secondary causes.  Once we know the ultrasound results we can consider treatment for your symptoms such as placing an IUD given you did not do well with the oral combined contraceptive pills. We are also following up with your cholesterol via blood work, you will see those results on MyChart. You do have a right sided ear infection, I sent you in an antibiotic that you take twice per day for the next 5 days for your symptoms.  You can also use Tylenol/ibuprofen  as needed for pain management.  Please follow up as needed for persistent symptoms and pending ultrasound results   We are checking some labs today. If they are abnormal, I will call you. If they are normal, I will send you a MyChart message (if it is active) or a letter in the mail. If you do not hear about your labs in the next 2 weeks, please call the office.  Call the clinic at [phone] if your symptoms worsen or you have any concerns.  Please be sure to schedule follow up at the front desk before you leave today.   Izetta Nap, DO Family Medicine

## 2024-02-11 ENCOUNTER — Ambulatory Visit: Payer: Self-pay | Admitting: Family Medicine

## 2024-02-11 LAB — LIPID PANEL
Chol/HDL Ratio: 4 ratio (ref 0.0–4.4)
Cholesterol, Total: 192 mg/dL (ref 100–199)
HDL: 48 mg/dL
LDL Chol Calc (NIH): 122 mg/dL — ABNORMAL HIGH (ref 0–99)
Triglycerides: 123 mg/dL (ref 0–149)
VLDL Cholesterol Cal: 22 mg/dL (ref 5–40)

## 2024-02-11 LAB — TSH: TSH: 0.792 u[IU]/mL (ref 0.450–4.500)

## 2024-02-16 ENCOUNTER — Ambulatory Visit (HOSPITAL_COMMUNITY)

## 2024-03-07 ENCOUNTER — Ambulatory Visit (HOSPITAL_COMMUNITY)
